# Patient Record
Sex: Female | Born: 1950 | Race: Black or African American | Hispanic: No | Marital: Married | Smoking: Current every day smoker
Health system: Southern US, Community
[De-identification: ages and names within clinical notes are randomized; demographics above are authoritative.]

## PROBLEM LIST (undated history)

## (undated) DIAGNOSIS — I10 Essential (primary) hypertension: Secondary | ICD-10-CM

## (undated) DIAGNOSIS — F32A Depression, unspecified: Secondary | ICD-10-CM

## (undated) DIAGNOSIS — H409 Unspecified glaucoma: Secondary | ICD-10-CM

## (undated) DIAGNOSIS — F329 Major depressive disorder, single episode, unspecified: Secondary | ICD-10-CM

## (undated) DIAGNOSIS — M5136 Other intervertebral disc degeneration, lumbar region: Secondary | ICD-10-CM

## (undated) DIAGNOSIS — E785 Hyperlipidemia, unspecified: Secondary | ICD-10-CM

## (undated) DIAGNOSIS — E079 Disorder of thyroid, unspecified: Secondary | ICD-10-CM

## (undated) DIAGNOSIS — M4800 Spinal stenosis, site unspecified: Secondary | ICD-10-CM

## (undated) DIAGNOSIS — J302 Other seasonal allergic rhinitis: Secondary | ICD-10-CM

## (undated) HISTORY — DX: Depression, unspecified: F32.A

## (undated) HISTORY — DX: Hyperlipidemia, unspecified: E78.5

## (undated) HISTORY — DX: Other intervertebral disc degeneration, lumbar region: M51.36

## (undated) HISTORY — DX: Major depressive disorder, single episode, unspecified: F32.9

## (undated) HISTORY — DX: Spinal stenosis, site unspecified: M48.00

## (undated) HISTORY — DX: Disorder of thyroid, unspecified: E07.9

## (undated) HISTORY — DX: Unspecified glaucoma: H40.9

---

## 1999-05-06 ENCOUNTER — Other Ambulatory Visit: Admission: RE | Admit: 1999-05-06 | Discharge: 1999-05-06 | Payer: Self-pay | Admitting: *Deleted

## 1999-06-19 ENCOUNTER — Ambulatory Visit (HOSPITAL_COMMUNITY): Admission: RE | Admit: 1999-06-19 | Discharge: 1999-06-19 | Payer: Self-pay | Admitting: Gastroenterology

## 1999-07-28 ENCOUNTER — Other Ambulatory Visit: Admission: RE | Admit: 1999-07-28 | Discharge: 1999-07-28 | Payer: Self-pay | Admitting: *Deleted

## 1999-09-16 ENCOUNTER — Ambulatory Visit (HOSPITAL_COMMUNITY): Admission: RE | Admit: 1999-09-16 | Discharge: 1999-09-16 | Payer: Self-pay | Admitting: Obstetrics and Gynecology

## 1999-09-16 ENCOUNTER — Encounter (INDEPENDENT_AMBULATORY_CARE_PROVIDER_SITE_OTHER): Payer: Self-pay

## 2000-05-23 ENCOUNTER — Other Ambulatory Visit: Admission: RE | Admit: 2000-05-23 | Discharge: 2000-05-23 | Payer: Self-pay | Admitting: *Deleted

## 2002-10-22 ENCOUNTER — Other Ambulatory Visit: Admission: RE | Admit: 2002-10-22 | Discharge: 2002-10-22 | Payer: Self-pay | Admitting: Internal Medicine

## 2002-10-31 ENCOUNTER — Encounter: Payer: Self-pay | Admitting: Internal Medicine

## 2002-10-31 ENCOUNTER — Ambulatory Visit (HOSPITAL_COMMUNITY): Admission: RE | Admit: 2002-10-31 | Discharge: 2002-10-31 | Payer: Self-pay | Admitting: Internal Medicine

## 2003-05-21 ENCOUNTER — Encounter: Admission: RE | Admit: 2003-05-21 | Discharge: 2003-08-19 | Payer: Self-pay

## 2003-12-12 ENCOUNTER — Ambulatory Visit (HOSPITAL_COMMUNITY): Admission: RE | Admit: 2003-12-12 | Discharge: 2003-12-12 | Payer: Self-pay | Admitting: Family Medicine

## 2003-12-24 ENCOUNTER — Other Ambulatory Visit: Admission: RE | Admit: 2003-12-24 | Discharge: 2003-12-24 | Payer: Self-pay | Admitting: Family Medicine

## 2005-01-13 ENCOUNTER — Inpatient Hospital Stay (HOSPITAL_COMMUNITY): Admission: RE | Admit: 2005-01-13 | Discharge: 2005-01-18 | Payer: Self-pay | Admitting: Psychiatry

## 2005-01-13 ENCOUNTER — Ambulatory Visit: Payer: Self-pay | Admitting: Psychiatry

## 2005-02-04 ENCOUNTER — Other Ambulatory Visit: Admission: RE | Admit: 2005-02-04 | Discharge: 2005-02-04 | Payer: Self-pay | Admitting: Family Medicine

## 2005-11-02 ENCOUNTER — Encounter: Admission: RE | Admit: 2005-11-02 | Discharge: 2005-11-02 | Payer: Self-pay | Admitting: Orthopedic Surgery

## 2005-11-15 ENCOUNTER — Encounter: Admission: RE | Admit: 2005-11-15 | Discharge: 2005-11-15 | Payer: Self-pay | Admitting: Orthopedic Surgery

## 2007-10-06 ENCOUNTER — Emergency Department (HOSPITAL_COMMUNITY): Admission: EM | Admit: 2007-10-06 | Discharge: 2007-10-06 | Payer: Self-pay | Admitting: Emergency Medicine

## 2008-05-07 ENCOUNTER — Ambulatory Visit (HOSPITAL_COMMUNITY): Admission: RE | Admit: 2008-05-07 | Discharge: 2008-05-07 | Payer: Self-pay | Admitting: Family Medicine

## 2009-05-27 ENCOUNTER — Emergency Department (HOSPITAL_COMMUNITY): Admission: EM | Admit: 2009-05-27 | Discharge: 2009-05-27 | Payer: Self-pay | Admitting: Family Medicine

## 2010-05-06 ENCOUNTER — Emergency Department (HOSPITAL_COMMUNITY): Admission: EM | Admit: 2010-05-06 | Discharge: 2010-05-06 | Payer: Self-pay | Admitting: Emergency Medicine

## 2010-05-08 ENCOUNTER — Ambulatory Visit (HOSPITAL_COMMUNITY): Admission: RE | Admit: 2010-05-08 | Discharge: 2010-05-08 | Payer: Self-pay | Admitting: Family Medicine

## 2010-11-08 ENCOUNTER — Encounter: Payer: Self-pay | Admitting: Family Medicine

## 2011-03-05 NOTE — Op Note (Signed)
[facility]  Patient:    Carolyn Morse                         MRN: [id number] Proc. Date: 09/16/99 Adm. Date:  [id number] Attending:  Lenoard Aden CC:         [facility]                           Operative Report  PREOPERATIVE DIAGNOSIS:  Endometrial mass with perimenopausal dysfunctional uterine bleeding.  POSTOPERATIVE DIAGNOSIS:  Endometrial polyp and thickened endometrium.  OPERATION:  Diagnostic hysteroscopy with resectoscopic polypectomy, dilatation nd curettage.  Patient recovering in good condition.  No complications.  SURGEON:  Lenoard Aden, M.D.  ANESTHESIA:  MAC and paracervical.  ESTIMATED BLOOD LOSS:  Less than 50 cc.  FLUID DEFICIT:  90 cc.  COMPLICATIONS:  None.  SPECIMENS:  Polyp and endometrial curettings.  DESCRIPTION OF PROCEDURE:  Having been apprised of the risks of anesthesia, infection, bleeding, uterine perforation, injury to abdominal organs and need for repair, patient was brought to the operating room where she was prepped and draped in the usual sterile fashion, catheterized until bladder was empty. Examination under anesthesia showed an enlarged retroflexed uterus and no adnexal masses. Uterus was grasped using a single-toothed tenaculum.  20 cc dilute Xylocaine solution placed as a paracervica block.  Dilute Pitressin solution placed at 3 nd 9 oclock cervicovaginal junction, 18 cc total.  At this time the uterus was dilated up to a #31 Pratt dilator atraumatically.  Hysteroscope placed. Visualization reveals a large posterior wall polyp which was sessile in nature. It was resected easily using a double loop resectoscope and sent to pathology for confirmation.  D&C was performed revealing copious amounts of curettings which ere also sent to pathology.  Good hemostasis was achieved.  Visualization revealed empty cavity at this time.  All instruments removed.  The patient tolerated  the  procedure well after all instruments removed and transferred to recovery in good condition. DD:  09/16/99 TD:  09/17/99 Job: [id number]

## 2011-03-05 NOTE — H&P (Signed)
[facility]  Patient:    Carolyn Morse                         MRN: [id number] Adm. Date:  [id number] Attending:  Lenoard Aden                         History and Physical  HISTORY OF PRESENT ILLNESS:   The patient is a 60 year old female, gravida 2, para 2, who presents for perimenopausal bleeding and questionable structural defect n saline sonohysterogram.  PAST MEDICAL HISTORY:         Remarkable for diverticulosis, migraine headaches, hypertension, and smoking.  PAST SURGICAL HISTORY:        Wisdom tooth surgery, oral surgery, and two vaginal deliveries.  MEDICATIONS:                  1. Verapamil.                               2. Levobid.                               3. Iron.                               4. Imitrex.  SOCIAL HISTORY:               The patient smokes one pack a day.  She denies domestic or physical violence.  PHYSICAL EXAMINATION:  GENERAL:                      The patient is a well-developed, well-nourished female in no acute distress.  VITAL SIGNS:                  Weight 203 pounds.  HEENT:                        Normal.  LUNGS:                        Clear.  HEART:                        Regular rate and rhythm.  ABDOMEN:                      Soft and nontender.  PELVIC:                       Uterus is enlarged, retroverted.  Fibroids and questionable endometrial mass noted on ultrasound.  EXTREMITIES:                  Reveal no cords.  NEUROLOGICAL:                 Nonfocal.  IMPRESSION:                   Perimenopausal dysfunctional uterine bleeding with questionable structural defect on saline sonohysterogram.  PLAN:                         To proceed with diagnostic hysteroscopy with resectoscope.  Risks of anesthesia, infection, bleeding, injury to abdominal organs with need for repair is discussed.  The patient acknowledges and desires to proceed. DD:  09/16/99 TD:  09/16/99 Job:  [id number]
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
## 2011-05-21 ENCOUNTER — Ambulatory Visit (INDEPENDENT_AMBULATORY_CARE_PROVIDER_SITE_OTHER): Payer: 59

## 2011-05-21 ENCOUNTER — Inpatient Hospital Stay (INDEPENDENT_AMBULATORY_CARE_PROVIDER_SITE_OTHER)
Admission: RE | Admit: 2011-05-21 | Discharge: 2011-05-21 | Disposition: A | Discharge status: Home / Self Care | Payer: 59 | Source: Ambulatory Visit | Attending: Family Medicine | Admitting: Family Medicine

## 2011-05-21 DIAGNOSIS — R05 Cough: Secondary | ICD-10-CM

## 2011-05-21 DIAGNOSIS — J40 Bronchitis, not specified as acute or chronic: Secondary | ICD-10-CM

## 2011-05-21 DIAGNOSIS — J019 Acute sinusitis, unspecified: Secondary | ICD-10-CM

## 2011-05-21 LAB — POCT I-STAT, CHEM 8
BUN: 5 mg/dL — ABNORMAL LOW (ref 6–23)
Calcium, Ion: 1.18 mmol/L (ref 1.12–1.32)
Chloride: 101 mEq/L (ref 96–112)
Creatinine, Ser: 0.8 mg/dL (ref 0.50–1.10)
HCT: 45 % (ref 36.0–46.0)
Hemoglobin: 15.3 g/dL — ABNORMAL HIGH (ref 12.0–15.0)
Potassium: 3.7 mEq/L (ref 3.5–5.1)
Sodium: 141 mEq/L (ref 135–145)

## 2011-05-21 LAB — POCT RAPID STREP A: Streptococcus, Group A Screen (Direct): NEGATIVE

## 2012-02-22 ENCOUNTER — Other Ambulatory Visit (HOSPITAL_COMMUNITY): Payer: Self-pay | Admitting: Family Medicine

## 2012-02-22 DIAGNOSIS — Z1231 Encounter for screening mammogram for malignant neoplasm of breast: Secondary | ICD-10-CM

## 2012-02-22 DIAGNOSIS — Z780 Asymptomatic menopausal state: Secondary | ICD-10-CM

## 2012-04-03 ENCOUNTER — Encounter (HOSPITAL_COMMUNITY): Payer: Self-pay | Admitting: *Deleted

## 2012-04-03 ENCOUNTER — Emergency Department (INDEPENDENT_AMBULATORY_CARE_PROVIDER_SITE_OTHER)
Admission: EM | Admit: 2012-04-03 | Discharge: 2012-04-03 | Disposition: A | Discharge status: Home / Self Care | Payer: 59 | Source: Home / Self Care | Attending: Emergency Medicine | Admitting: Emergency Medicine

## 2012-04-03 DIAGNOSIS — J040 Acute laryngitis: Secondary | ICD-10-CM

## 2012-04-03 DIAGNOSIS — J209 Acute bronchitis, unspecified: Secondary | ICD-10-CM

## 2012-04-03 HISTORY — DX: Essential (primary) hypertension: I10

## 2012-04-03 HISTORY — DX: Other seasonal allergic rhinitis: J30.2

## 2012-04-03 MED ORDER — FLUTICASONE PROPIONATE HFA 110 MCG/ACT IN AERO: 1.0000 | INHALATION_SPRAY | Freq: Two times a day (BID) | RESPIRATORY_TRACT | Status: DC

## 2012-04-03 MED ORDER — HYDROCOD POLST-CHLORPHEN POLST 10-8 MG/5ML PO LQCR: 5.0000 mL | Freq: Every evening | ORAL | Status: DC | PRN

## 2012-04-03 NOTE — ED Provider Notes (Signed)
History     CSN: [id number]  Arrival date & time 04/03/12  1114   First MD Initiated Contact with Patient 04/03/12 1115      Chief Complaint  Patient presents with  . URI    HPI   1 wk of cough, sore throat, chest congestion, loss of voice. Now has some trouble taking a deep breath and some dyspnea on exertion. Having some rhinorrhea.  Has tried Nyquil and Mucinex. Continues to smoke.  Right side of her back is sore and worse when she coughs.   Past Medical History  Diagnosis Date  . Diabetes mellitus   . Seasonal allergies   . Hypertension     History reviewed. No pertinent past surgical history.  Family History  Problem Relation Age of Onset  . Family history unknown: Yes    History  Substance Use Topics  . Smoking status:  current smoker- 4-6 cig per day  . Smokeless tobacco: no  . Alcohol Use: No  Works as Child psychotherapist. Lives w/ husband who is not sick.   OB History    Grav Para Term Preterm Abortions TAB SAB Ect Mult Living                  Review of Systems  Constitutional: Positive for fever, activity change, appetite change and fatigue.  HENT: Positive for congestion, rhinorrhea, sneezing and postnasal drip. Negative for hearing loss, ear pain, nosebleeds, tinnitus and ear discharge.   Eyes: Negative.   Respiratory: Positive for cough, chest tightness and shortness of breath. Negative for wheezing.   Cardiovascular: Negative.   Gastrointestinal: Negative.   Genitourinary: Negative.   Musculoskeletal: Negative.   Neurological: Negative.   Psychiatric/Behavioral: Negative.     Allergies  Review of patient's allergies indicates no known allergies.  Home Medications   Current Outpatient Rx  Name Route Sig Dispense Refill  . AMLODIPINE  Oral Take 1 capsule by mouth daily.    . DULOXETINE HCL 20 MG PO CPEP Oral Take by mouth daily.    Marland Kitchen FLUTICASONE PROPIONATE 50 MCG/ACT NA SUSP Nasal Place into the nose daily.    Marland Kitchen LEVOTHYROXINE SODIUM 100 MCG  PO TABS Oral Take by mouth daily.    Marland Kitchen METFORMIN HCL 1000 MG PO TABS Oral Take by mouth 2 (two) times daily with a meal.    . MONTELUKAST SODIUM 10 MG PO TABS Oral Take by mouth at bedtime.    Also taking another BP pill.  Albuterol inhaler Atorvastatin.   BP 117/75  Pulse 97  Temp 98.4 F (36.9 C) (Oral)  Resp 18  SpO2 100%  Physical Exam  Constitutional: She is oriented to person, place, and time. She appears well-developed and well-nourished.  HENT:  Head: Normocephalic.  Mouth/Throat: No oropharyngeal exudate.  Eyes: Conjunctivae and EOM are normal. Pupils are equal, round, and reactive to light.  Cardiovascular: Normal rate, regular rhythm and normal heart sounds.   Pulmonary/Chest: Effort normal and breath sounds normal. She has no wheezes. She has no rales. She exhibits no tenderness.       Hoarse cough  Abdominal: Soft. Bowel sounds are normal.  Neurological: She is alert and oriented to person, place, and time.  Skin: Skin is warm and dry.  Psychiatric: She has a normal mood and affect. Her behavior is normal. Judgment and thought content normal.    ED Course  Procedures (including critical care time)  Labs Reviewed - No data to display No results found.   No  diagnosis found.    MDM  Viral bronchitis - codeine containing cough surpressent and flovent        Calvert Cantor, MD 04/03/12 1750

## 2012-04-03 NOTE — ED Notes (Signed)
Pt reports loss of voice and cough/congestion for the past week. States that cough is "sometimes " productive.

## 2012-05-09 ENCOUNTER — Ambulatory Visit (HOSPITAL_COMMUNITY)
Admission: RE | Admit: 2012-05-09 | Discharge: 2012-05-09 | Disposition: A | Discharge status: Home / Self Care | Payer: 59 | Source: Ambulatory Visit | Attending: Family Medicine | Admitting: Family Medicine

## 2012-05-09 DIAGNOSIS — Z1231 Encounter for screening mammogram for malignant neoplasm of breast: Secondary | ICD-10-CM

## 2012-05-09 DIAGNOSIS — Z780 Asymptomatic menopausal state: Secondary | ICD-10-CM

## 2012-05-12 ENCOUNTER — Other Ambulatory Visit: Payer: Self-pay | Admitting: Family Medicine

## 2012-05-12 DIAGNOSIS — R928 Other abnormal and inconclusive findings on diagnostic imaging of breast: Secondary | ICD-10-CM

## 2012-05-18 ENCOUNTER — Ambulatory Visit
Admission: RE | Admit: 2012-05-18 | Discharge: 2012-05-18 | Disposition: A | Discharge status: Home / Self Care | Payer: 59 | Source: Ambulatory Visit | Attending: Family Medicine | Admitting: Family Medicine

## 2012-05-18 DIAGNOSIS — R928 Other abnormal and inconclusive findings on diagnostic imaging of breast: Secondary | ICD-10-CM

## 2012-12-21 ENCOUNTER — Other Ambulatory Visit (HOSPITAL_COMMUNITY): Payer: Self-pay | Admitting: *Deleted

## 2012-12-21 ENCOUNTER — Ambulatory Visit (HOSPITAL_COMMUNITY)
Admission: RE | Admit: 2012-12-21 | Discharge: 2012-12-21 | Disposition: A | Discharge status: Home / Self Care | Payer: Disability Insurance | Source: Ambulatory Visit | Attending: Family Medicine | Admitting: Family Medicine

## 2012-12-21 DIAGNOSIS — M1990 Unspecified osteoarthritis, unspecified site: Secondary | ICD-10-CM

## 2012-12-21 DIAGNOSIS — M542 Cervicalgia: Secondary | ICD-10-CM | POA: Insufficient documentation

## 2012-12-21 DIAGNOSIS — M47812 Spondylosis without myelopathy or radiculopathy, cervical region: Secondary | ICD-10-CM | POA: Insufficient documentation

## 2014-11-03 IMAGING — CR DG CERVICAL SPINE 2 OR 3 VIEWS
3 series · 3 of 3 positions shown · non-contrast
Comparison: None

CLINICAL DATA: Neck pain.  Osteoarthritis.

CERVICAL SPINE - 2-3 VIEW

[view not recorded (1 of 3)]
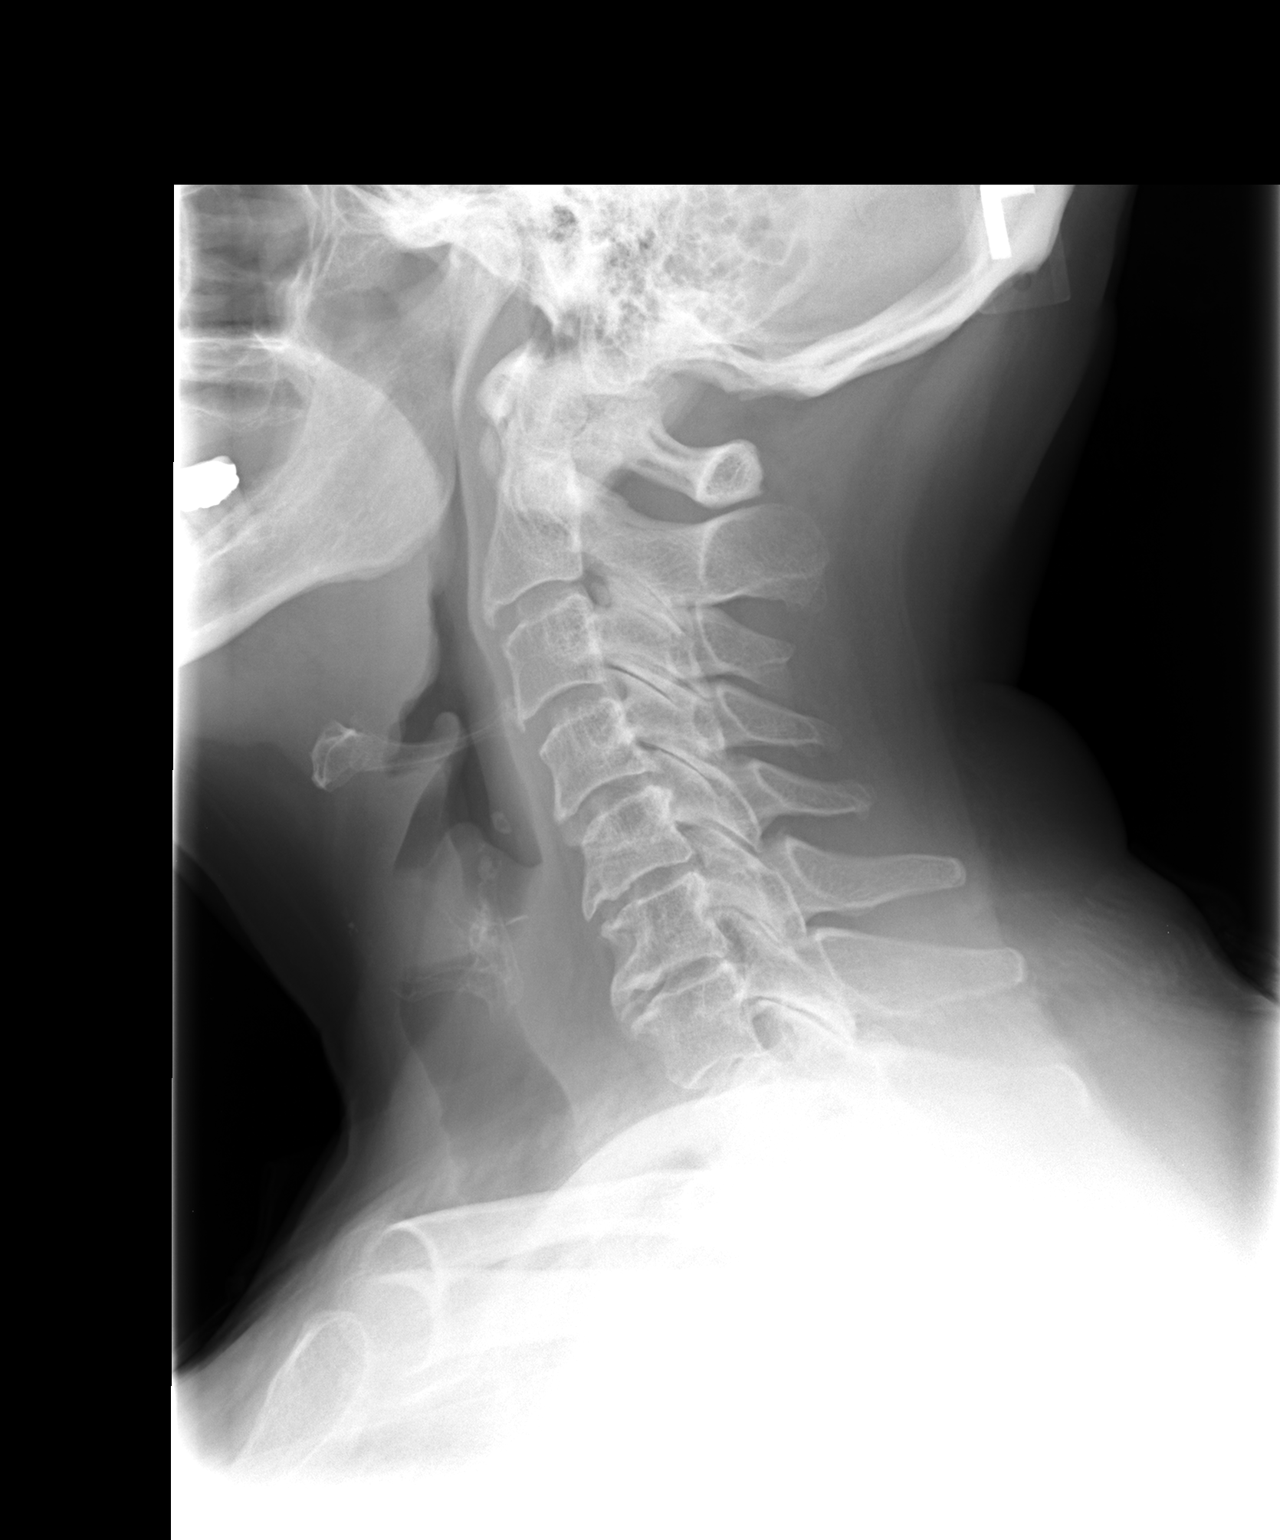

[view not recorded (2 of 3)]
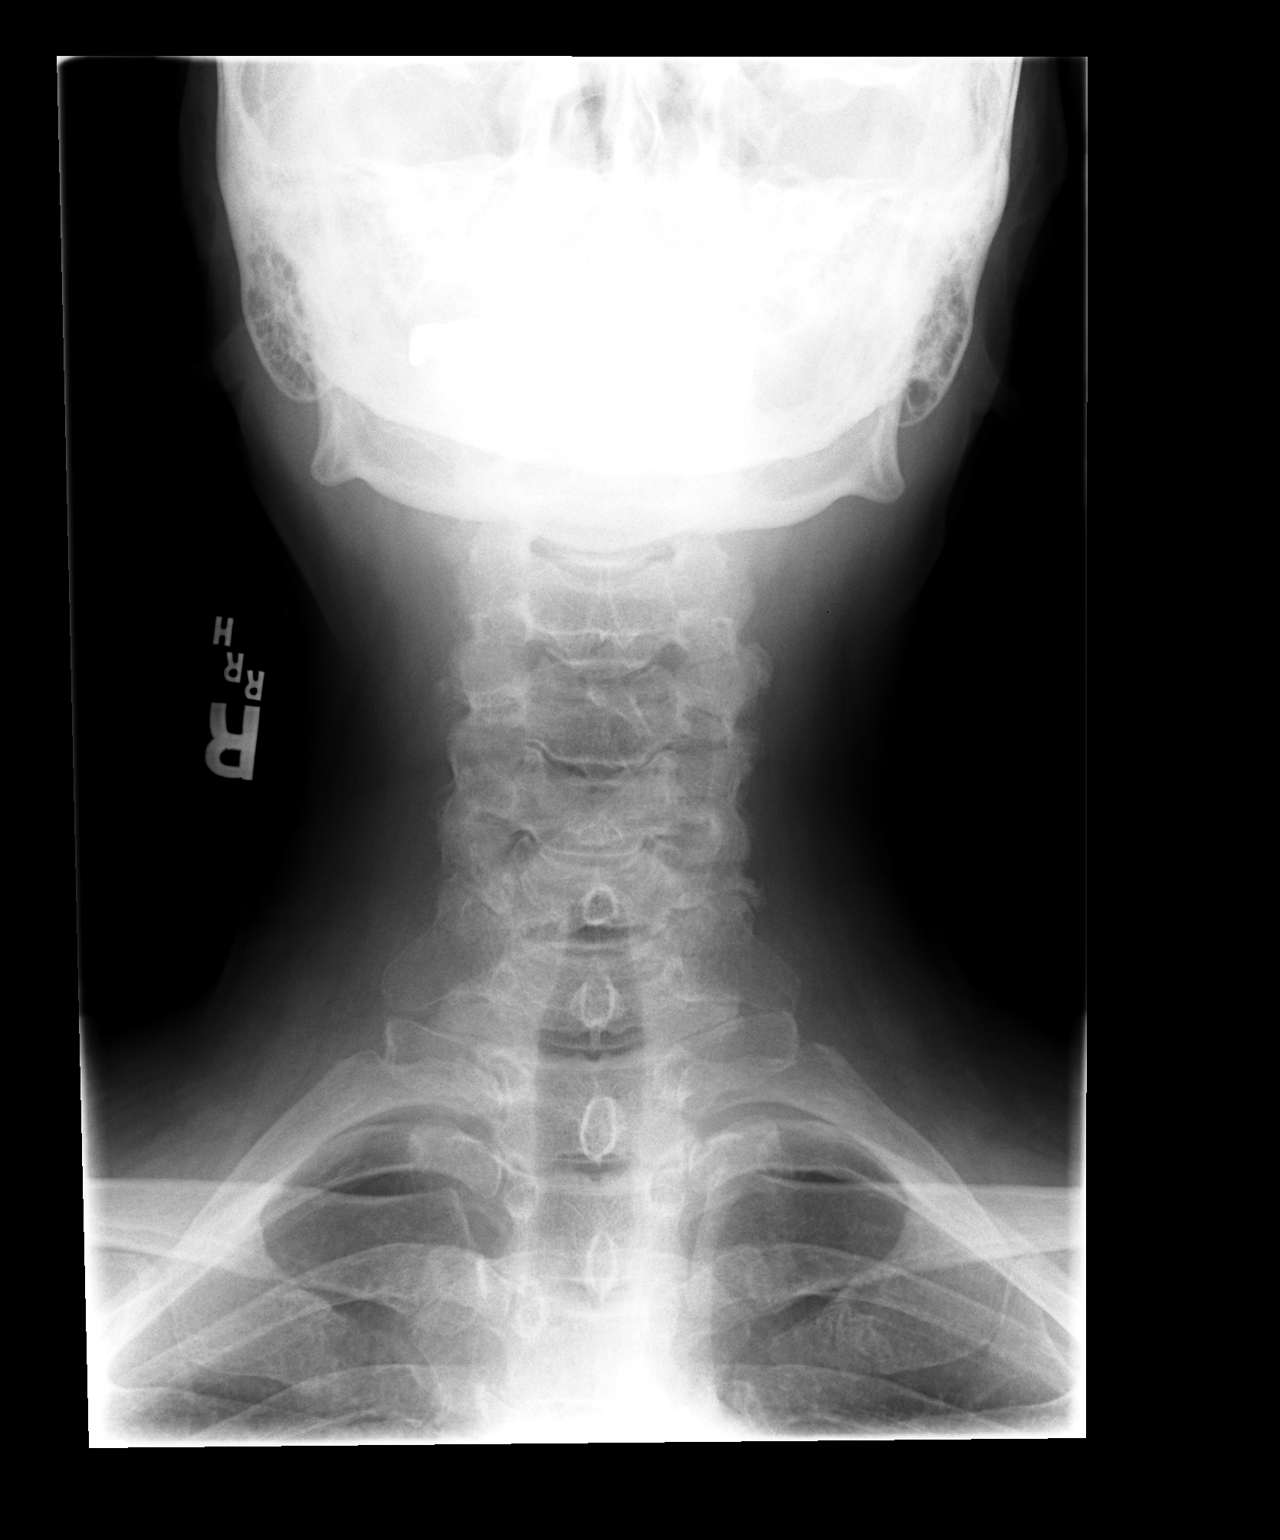

[view not recorded (3 of 3)]
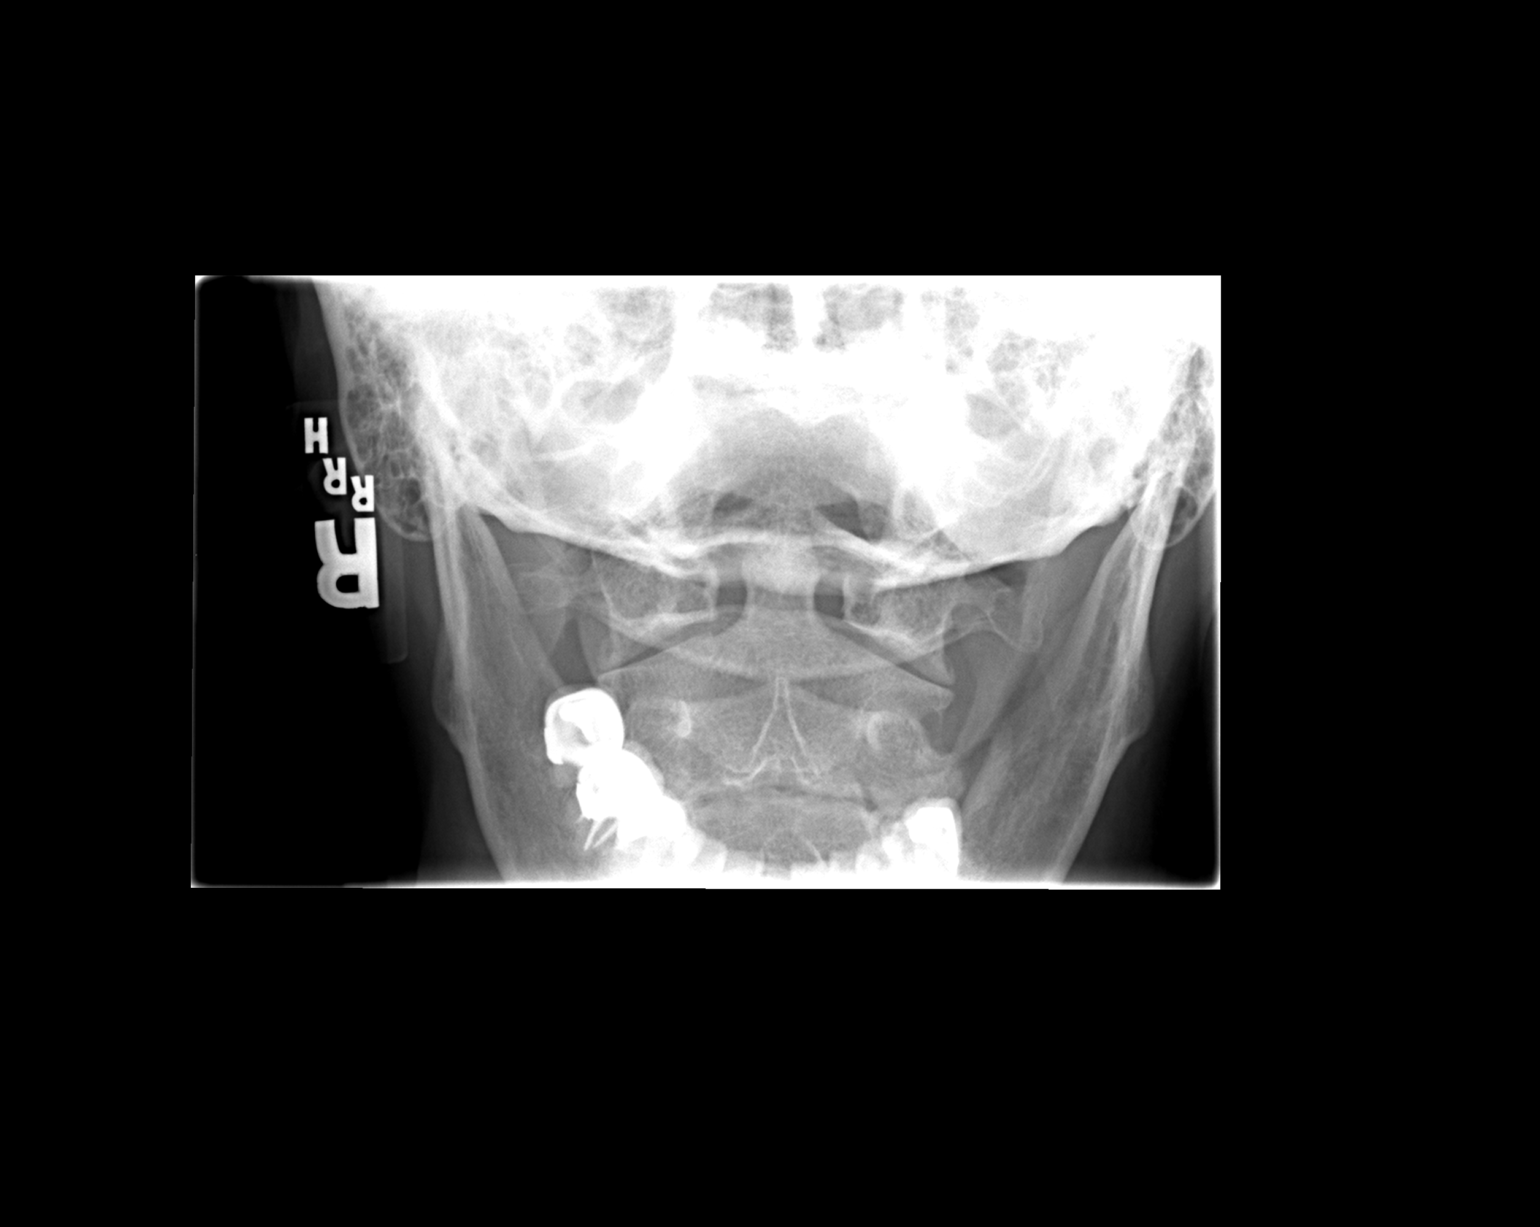

[3 of 3 positions shown; findings below may reference images not displayed]

FINDINGS: Disc degeneration and spondylosis at C4-5 and C5-6 and C6-
7 of a moderate degree.  Mild facet degeneration throughout the
cervical spine.  Negative for fracture or mass lesion.  Oblique
views were not obtained to evaluate for foraminal encroachment.
IMPRESSION: Moderate spondylosis.

## 2016-11-10 DIAGNOSIS — M5441 Lumbago with sciatica, right side: Secondary | ICD-10-CM | POA: Diagnosis not present

## 2016-11-10 DIAGNOSIS — M545 Low back pain: Secondary | ICD-10-CM | POA: Diagnosis not present

## 2016-11-10 DIAGNOSIS — F329 Major depressive disorder, single episode, unspecified: Secondary | ICD-10-CM | POA: Diagnosis not present

## 2016-11-10 DIAGNOSIS — M5431 Sciatica, right side: Secondary | ICD-10-CM | POA: Diagnosis not present

## 2016-11-10 DIAGNOSIS — E119 Type 2 diabetes mellitus without complications: Secondary | ICD-10-CM | POA: Diagnosis not present

## 2016-11-10 DIAGNOSIS — K219 Gastro-esophageal reflux disease without esophagitis: Secondary | ICD-10-CM | POA: Diagnosis not present

## 2016-11-10 DIAGNOSIS — Z87891 Personal history of nicotine dependence: Secondary | ICD-10-CM | POA: Diagnosis not present

## 2016-11-19 DIAGNOSIS — M5416 Radiculopathy, lumbar region: Secondary | ICD-10-CM | POA: Diagnosis not present

## 2016-12-02 DIAGNOSIS — M5416 Radiculopathy, lumbar region: Secondary | ICD-10-CM | POA: Diagnosis not present

## 2016-12-21 DIAGNOSIS — M5416 Radiculopathy, lumbar region: Secondary | ICD-10-CM | POA: Diagnosis not present

## 2017-01-06 DIAGNOSIS — M5416 Radiculopathy, lumbar region: Secondary | ICD-10-CM | POA: Diagnosis not present

## 2017-01-28 DIAGNOSIS — M5416 Radiculopathy, lumbar region: Secondary | ICD-10-CM | POA: Diagnosis not present

## 2017-02-03 DIAGNOSIS — M545 Low back pain: Secondary | ICD-10-CM | POA: Diagnosis not present

## 2017-02-07 DIAGNOSIS — M48062 Spinal stenosis, lumbar region with neurogenic claudication: Secondary | ICD-10-CM | POA: Diagnosis not present

## 2017-02-22 DIAGNOSIS — M48062 Spinal stenosis, lumbar region with neurogenic claudication: Secondary | ICD-10-CM | POA: Diagnosis not present

## 2017-03-21 DIAGNOSIS — M48062 Spinal stenosis, lumbar region with neurogenic claudication: Secondary | ICD-10-CM | POA: Diagnosis not present

## 2017-04-06 ENCOUNTER — Ambulatory Visit (INDEPENDENT_AMBULATORY_CARE_PROVIDER_SITE_OTHER): Payer: PPO | Admitting: Family Medicine

## 2017-04-06 ENCOUNTER — Encounter: Payer: Self-pay | Admitting: Family Medicine

## 2017-04-06 VITALS — BP 136/72 | HR 114 | Temp 98.0°F | Resp 14 | Ht 66.5 in | Wt 210.0 lb

## 2017-04-06 DIAGNOSIS — I10 Essential (primary) hypertension: Secondary | ICD-10-CM | POA: Diagnosis not present

## 2017-04-06 DIAGNOSIS — H409 Unspecified glaucoma: Secondary | ICD-10-CM | POA: Diagnosis not present

## 2017-04-06 DIAGNOSIS — H0260 Xanthelasma of unspecified eye, unspecified eyelid: Secondary | ICD-10-CM

## 2017-04-06 DIAGNOSIS — M5136 Other intervertebral disc degeneration, lumbar region: Secondary | ICD-10-CM

## 2017-04-06 DIAGNOSIS — E785 Hyperlipidemia, unspecified: Secondary | ICD-10-CM | POA: Diagnosis not present

## 2017-04-06 DIAGNOSIS — F5104 Psychophysiologic insomnia: Secondary | ICD-10-CM | POA: Diagnosis not present

## 2017-04-06 DIAGNOSIS — E039 Hypothyroidism, unspecified: Secondary | ICD-10-CM

## 2017-04-06 DIAGNOSIS — Z0000 Encounter for general adult medical examination without abnormal findings: Secondary | ICD-10-CM

## 2017-04-06 DIAGNOSIS — F331 Major depressive disorder, recurrent, moderate: Secondary | ICD-10-CM | POA: Diagnosis not present

## 2017-04-06 DIAGNOSIS — M48062 Spinal stenosis, lumbar region with neurogenic claudication: Secondary | ICD-10-CM | POA: Diagnosis not present

## 2017-04-06 DIAGNOSIS — E1169 Type 2 diabetes mellitus with other specified complication: Secondary | ICD-10-CM

## 2017-04-06 DIAGNOSIS — M4800 Spinal stenosis, site unspecified: Secondary | ICD-10-CM | POA: Insufficient documentation

## 2017-04-06 DIAGNOSIS — M51369 Other intervertebral disc degeneration, lumbar region without mention of lumbar back pain or lower extremity pain: Secondary | ICD-10-CM | POA: Insufficient documentation

## 2017-04-06 DIAGNOSIS — F329 Major depressive disorder, single episode, unspecified: Secondary | ICD-10-CM | POA: Insufficient documentation

## 2017-04-06 DIAGNOSIS — E119 Type 2 diabetes mellitus without complications: Secondary | ICD-10-CM | POA: Diagnosis not present

## 2017-04-06 NOTE — Assessment & Plan Note (Signed)
Fair control HR elevated out of Coreg, will give bystolic 5mg  daily sample until she is able to get her medications in

## 2017-04-06 NOTE — Progress Notes (Signed)
Subjective:   Patient presents for Medicare Annual/Subsequent preventive examination.  Patient here to establish care and for welcome to Medicare examination. [facility] she is retired from [employer]  She has notation from no foreign healthcare back in 02/27/2012 at that time she is being treated for diabetes mellitus as well as hypertension, hot flashes and episodes of bronchitis. Medications and History Reviewed   Diabetes mellitus diagnosed greater than 10 years ago. She states that she had an A1c done a couple months ago was around 8% however chlamydial ride was added to her metformin. She admits that she is a lot of snacks and junk food has been to a nutritionist in the past but this has not changed her eating habits. She also had little activity.  She has history of chronic back pain has spinal stenosis and degenerative disc disease she is followed by [facility] she's had injections in her spine she is trying to avoid back surgery. She is on hydrocodone which now is prescribed from the [employer] as well as Flexeril and meloxicam states that she is also on venlafaxine because of her pain. In the past she was on Cymbalta but this was not covered by her insurance. However in lieu of that she has history of depression and has been on Prozac and respiratory down she had an admission to behavioral health back to 2005-02-26 after her son died from complications of type 1 diabetes. She states that she has felt depressed since then. She was on lorazepam to help her sleep but this was discontinued by the [employer]. She has now been on Ambien which she takes 5 mg as needed.  History of hypothyroidism found via routine blood work she has been on thyroid replacement for greater than 10 years.  History of hypertension she is on amlodipine and losartan and carvedilol states that her carvedilol ran out she is waiting for it to arrive therefore she has not been on  the past few days she has not had any palpitations.  She states she's had all of her immunizations she's had Pap smear mammogram bone density all done at either [facility] [facility] or the [facility]  Tobacco use she does smoke intermittently a few cigarettes a day has stopped in the past but then will pick it back up. She states she does not smoke continuously  She would likely refer to dermatology for recurrence of the plaques around her eyes she notes it is due to her cholesterol. She was being followed by them in the past.  History of glaucoma and she is on eyedrops she is not sure about retinopathy [facility] but she does have a local ophthalmologist which she cannot recall the name   Review Past Medical/Family/Social: Per EMR    Risk Factors  Current exercise habits:  Dietary issues discussed:   Cardiac risk factors: Obesity (BMI >= 30 kg/m2).   Depression Screen  (Note: if answer to either of the following is "Yes", a more complete depression screening is indicated)  Over the past two weeks, have you felt down, depressed or hopeless? No Over the past two weeks, have you felt little interest or pleasure in doing things? No Have you lost interest or pleasure in daily life? No Do you often feel hopeless? No Do you cry easily over simple problems? No   Activities of Daily Living  In your present state of health, do you have any difficulty performing the  following activities?:  Driving? No  Managing money? No  Feeding yourself? No  Getting from bed to chair? No  Climbing a flight of stairs? yes  Preparing food and eating?: No  Bathing or showering? No  Getting dressed: No  Getting to the toilet? No  Using the toilet:No  Moving around from place to place: No  In the past year have you fallen or had a near fall?:No  Are you sexually active? Yes Do you have more than one partner? No   Hearing Difficulties: No  Do you often  ask people to speak up or repeat themselves? No  Do you experience ringing or noises in your ears? No Do you have difficulty understanding soft or whispered voices? No  Do you feel that you have a problem with memory? No Do you often misplace items? No  Do you feel safe at home? Yes  Cognitive Testing  Alert? Yes Normal Appearance?Yes  Oriented to person? Yes Place? Yes  Time? Yes  Recall of three objects? Yes  Can perform simple calculations? Yes  Displays appropriate judgment?Yes  Can read the correct time from a watch face?Yes   List the Names of Other Physician/Practitioners you currently use: [facility]   Screening Tests / Date  ( AWAIT RECORDS DONE PER [facility] AT [facility]) Colonoscopy  - Cologaurd per pt                    Zostavax  Mammogram  Influenza Vaccine  Tetanus/tdap  ROS: GEN- denies fatigue, fever, weight loss,weakness, recent illness HEENT- denies eye drainage, change in vision, nasal discharge, CVS- denies chest pain, palpitations RESP- denies SOB, cough, wheeze ABD- denies N/V, change in stools, abd pain GU- denies dysuria, hematuria, dribbling, incontinence MSK- + joint pain, muscle aches, injury Neuro- denies headache, dizziness, syncope, seizure activity  Physical: GEN- NAD, alert and oriented x3,obese  HEENT- PERRL, EOMI, non injected sclera, pink conjunctiva, MMM, oropharynx clear, yellow plaque like lesions upper lids and below lower lids bilat  Neck- Supple, no thryomegaly CVS- Tachycardic HR 110, no murmur RESP-CTAB ABD-NABS,soft,NT,ND Psych- Normal affect and mood, very polite  EXT- No edema Pulses- Radial, DP- 2+     Assessment:    Annual wellness medicare exam   Plan:    During the course of the visit the patient was educated and counseled about appropriate screening and preventive services including:  [facility] and [facility]  She is UTD on immunizations and preventive medicine per report [place] the [place] she request  return to her dermatologist, referral to be done   See below   Diet review for nutrition referral? Yes ____ Not Indicated __x__  PT DECLINES  Patient Instructions (the written plan) was given to the patient.  Medicare Attestation  I have personally reviewed:  The patient's medical and social history  Their use of alcohol, tobacco or illicit drugs  Their current medications and supplements  The patient's functional ability including ADLs,fall risks, home safety risks, cognitive, and hearing and visual impairment  Diet and physical activities  Evidence for depression or mood disorders  The patient's weight, height, BMI, and visual acuity have been recorded in the chart. I have made referrals, counseling, and provided education to the patient based on review of the above and I have provided the patient with a written personalized care plan for preventive services.

## 2017-04-06 NOTE — Assessment & Plan Note (Signed)
Continue statin drug   

## 2017-04-06 NOTE — Assessment & Plan Note (Signed)
Continue with orthopedics, obtain records

## 2017-04-06 NOTE — Assessment & Plan Note (Signed)
Obtain records- she will print her [facility] labs and records and return for my review Recent addition of glimperide, discussed her diet/nutrition which she admits to poor eating and snacking She is on statin and ACE

## 2017-04-06 NOTE — Assessment & Plan Note (Addendum)
Underlying depression stemming from her sons death years ago She is on effexor for both pain control and mood Now off benzos

## 2017-04-06 NOTE — Patient Instructions (Addendum)
[facility]  Print off records- from last 2 years for labs/ notes,/ PAP smear/Colonoscopy/Mammogram/Bone Density /Shot records Referral to dermatology - Dr. Margo AyeHall  Take the bystolic 5mg  once a day until your COREG comes it F/U 3 months

## 2017-04-06 NOTE — Assessment & Plan Note (Signed)
Maintained on prn Remus Lofflerambien

## 2017-05-26 DIAGNOSIS — M48062 Spinal stenosis, lumbar region with neurogenic claudication: Secondary | ICD-10-CM | POA: Diagnosis not present

## 2017-07-08 ENCOUNTER — Ambulatory Visit: Payer: PPO | Admitting: Family Medicine

## 2017-07-13 ENCOUNTER — Encounter: Payer: Self-pay | Admitting: Family Medicine

## 2017-07-26 DIAGNOSIS — M48062 Spinal stenosis, lumbar region with neurogenic claudication: Secondary | ICD-10-CM | POA: Diagnosis not present

## 2017-11-01 LAB — HM MAMMOGRAPHY

## 2017-11-30 DIAGNOSIS — M48062 Spinal stenosis, lumbar region with neurogenic claudication: Secondary | ICD-10-CM | POA: Diagnosis not present

## 2018-01-09 DIAGNOSIS — M47816 Spondylosis without myelopathy or radiculopathy, lumbar region: Secondary | ICD-10-CM | POA: Diagnosis not present

## 2018-01-09 DIAGNOSIS — M9903 Segmental and somatic dysfunction of lumbar region: Secondary | ICD-10-CM | POA: Diagnosis not present

## 2018-01-11 DIAGNOSIS — M47816 Spondylosis without myelopathy or radiculopathy, lumbar region: Secondary | ICD-10-CM | POA: Diagnosis not present

## 2018-01-11 DIAGNOSIS — M9903 Segmental and somatic dysfunction of lumbar region: Secondary | ICD-10-CM | POA: Diagnosis not present

## 2018-01-18 DIAGNOSIS — M48062 Spinal stenosis, lumbar region with neurogenic claudication: Secondary | ICD-10-CM | POA: Diagnosis not present

## 2018-01-23 DIAGNOSIS — M47816 Spondylosis without myelopathy or radiculopathy, lumbar region: Secondary | ICD-10-CM | POA: Diagnosis not present

## 2018-01-23 DIAGNOSIS — M9903 Segmental and somatic dysfunction of lumbar region: Secondary | ICD-10-CM | POA: Diagnosis not present

## 2018-01-26 DIAGNOSIS — M47816 Spondylosis without myelopathy or radiculopathy, lumbar region: Secondary | ICD-10-CM | POA: Diagnosis not present

## 2018-01-26 DIAGNOSIS — M9903 Segmental and somatic dysfunction of lumbar region: Secondary | ICD-10-CM | POA: Diagnosis not present

## 2018-01-30 DIAGNOSIS — M9903 Segmental and somatic dysfunction of lumbar region: Secondary | ICD-10-CM | POA: Diagnosis not present

## 2018-01-30 DIAGNOSIS — M47816 Spondylosis without myelopathy or radiculopathy, lumbar region: Secondary | ICD-10-CM | POA: Diagnosis not present

## 2018-02-02 DIAGNOSIS — M47816 Spondylosis without myelopathy or radiculopathy, lumbar region: Secondary | ICD-10-CM | POA: Diagnosis not present

## 2018-02-02 DIAGNOSIS — M9903 Segmental and somatic dysfunction of lumbar region: Secondary | ICD-10-CM | POA: Diagnosis not present

## 2018-02-09 DIAGNOSIS — M47816 Spondylosis without myelopathy or radiculopathy, lumbar region: Secondary | ICD-10-CM | POA: Diagnosis not present

## 2018-02-09 DIAGNOSIS — M9903 Segmental and somatic dysfunction of lumbar region: Secondary | ICD-10-CM | POA: Diagnosis not present

## 2018-03-03 DIAGNOSIS — M9903 Segmental and somatic dysfunction of lumbar region: Secondary | ICD-10-CM | POA: Diagnosis not present

## 2018-03-03 DIAGNOSIS — M47816 Spondylosis without myelopathy or radiculopathy, lumbar region: Secondary | ICD-10-CM | POA: Diagnosis not present

## 2018-03-17 DIAGNOSIS — M9903 Segmental and somatic dysfunction of lumbar region: Secondary | ICD-10-CM | POA: Diagnosis not present

## 2018-03-17 DIAGNOSIS — M47816 Spondylosis without myelopathy or radiculopathy, lumbar region: Secondary | ICD-10-CM | POA: Diagnosis not present

## 2018-05-18 DIAGNOSIS — M7061 Trochanteric bursitis, right hip: Secondary | ICD-10-CM | POA: Diagnosis not present

## 2018-05-18 DIAGNOSIS — M545 Low back pain: Secondary | ICD-10-CM | POA: Diagnosis not present

## 2018-06-01 ENCOUNTER — Other Ambulatory Visit: Payer: Self-pay

## 2018-06-01 ENCOUNTER — Ambulatory Visit (INDEPENDENT_AMBULATORY_CARE_PROVIDER_SITE_OTHER): Payer: PPO | Admitting: Family Medicine

## 2018-06-01 ENCOUNTER — Encounter: Payer: Self-pay | Admitting: Family Medicine

## 2018-06-01 ENCOUNTER — Encounter: Payer: Self-pay | Admitting: *Deleted

## 2018-06-01 VITALS — BP 134/78 | HR 62 | Temp 97.9°F | Resp 12 | Ht 67.0 in | Wt 212.0 lb

## 2018-06-01 DIAGNOSIS — Z9181 History of falling: Secondary | ICD-10-CM | POA: Diagnosis not present

## 2018-06-01 DIAGNOSIS — Z0000 Encounter for general adult medical examination without abnormal findings: Secondary | ICD-10-CM

## 2018-06-01 DIAGNOSIS — Z6833 Body mass index (BMI) 33.0-33.9, adult: Secondary | ICD-10-CM

## 2018-06-01 DIAGNOSIS — E1169 Type 2 diabetes mellitus with other specified complication: Secondary | ICD-10-CM

## 2018-06-01 DIAGNOSIS — E785 Hyperlipidemia, unspecified: Secondary | ICD-10-CM | POA: Diagnosis not present

## 2018-06-01 DIAGNOSIS — M5136 Other intervertebral disc degeneration, lumbar region: Secondary | ICD-10-CM

## 2018-06-01 DIAGNOSIS — M48062 Spinal stenosis, lumbar region with neurogenic claudication: Secondary | ICD-10-CM

## 2018-06-01 DIAGNOSIS — E039 Hypothyroidism, unspecified: Secondary | ICD-10-CM

## 2018-06-01 DIAGNOSIS — E669 Obesity, unspecified: Secondary | ICD-10-CM

## 2018-06-01 DIAGNOSIS — I10 Essential (primary) hypertension: Secondary | ICD-10-CM | POA: Diagnosis not present

## 2018-06-01 DIAGNOSIS — E119 Type 2 diabetes mellitus without complications: Secondary | ICD-10-CM

## 2018-06-01 DIAGNOSIS — Z1159 Encounter for screening for other viral diseases: Secondary | ICD-10-CM

## 2018-06-01 NOTE — Patient Instructions (Addendum)
  Ms. Carolyn Morse , Thank you for taking time to come for your Medicare Wellness Visit. I appreciate your ongoing commitment to your health goals. Please review the following plan we discussed and let me know if I can assist you in the future.   These are the goals we discussed: Goals    . DIET - REDUCE CALORIE INTAKE       This is a list of the screening recommended for you and due dates:  Health Maintenance  Topic Date Due  . Eye exam for diabetics  08/27/1961  . Colon Cancer Screening  08/27/2001  . Flu Shot  05/18/2018  . Pneumonia vaccines (2 of 2 - PPSV23) 08/09/2018  . Hemoglobin A1C  12/02/2018  . Complete foot exam   06/02/2019  . Mammogram  11/02/2019  . Tetanus Vaccine  02/21/2022  . DEXA scan (bone density measurement)  Completed  .  Hepatitis C: One time screening is recommended by Center for Disease Control  (CDC) for  adults born from [birth year] through 1965.   Completed     Medical Screening Exam A medical screening exam helps determine whether or not you need emergency medical treatment. During the medical screening exam, a health care provider does a short physical exam and medical history to assess:  Your current symptoms.  Your overall health.  Depending on your symptoms, you may need additional tests. What are the possible outcomes of a medical screening exam? Your medical screening exam may determine that:  You do not need emergency treatment at this time.  You need treatment right away.  You need to be transferred to another medical center.  When should I seek medical care? If you have a regular health care provider, make an appointment for a follow-up visit with him or her. If you do not have a regular health care provider, ask about resources in your community. Get help right away if: Your condition may change over time. If your condition gets worse or you develop new or troubling symptoms before you see your health care provider, go to an emergency  department right away. In an emergency:  Call 911 or have someone drive you to the nearest hospital.  Do not drive yourself. This information is not intended to replace advice given to you by your health care provider. Make sure you discuss any questions you have with your health care provider. Document Released: 11/11/2004 Document Revised: 06/15/2016 Document Reviewed: 07/17/2015 Elsevier Interactive Patient Education  [employer]2018 [employer]

## 2018-06-01 NOTE — Progress Notes (Signed)
Subjective:   Carolyn Morse is a 67 y.o. female who presents for Medicare Annual (Subsequent) preventive examination.  Sees Dr. Buelah Manis as PCP only for [facility] per pt and all other care is at [facility]  Review of Systems:  Review of Systems  Constitutional: Negative.  Negative for chills, fever and weight loss.  HENT: Negative.   Eyes: Negative.   Respiratory: Negative.   Cardiovascular: Negative.  Negative for chest pain, palpitations, orthopnea, claudication, leg swelling and PND.  Gastrointestinal: Negative.   Genitourinary: Negative.  Negative for dysuria, frequency and urgency.  Musculoskeletal: Positive for back pain, falls and joint pain. Negative for myalgias and neck pain.  Skin: Negative.   Neurological: Negative.   Endo/Heme/Allergies: Negative.   Psychiatric/Behavioral: Negative.  Negative for depression, memory loss and suicidal ideas. The patient is not nervous/anxious.   All other systems reviewed and are negative.   Cardiac Risk Factors include: advanced age (>42mn, >>29women);diabetes mellitus;dyslipidemia;hypertension;obesity (BMI >30kg/m2)  Patient reports a fall that occurred in March 2019 stating that she fell/tripped on a ramp on sidewalk going down to the parking lot she caught herself with her right hand and did hit her head was evaluated at that time.  She denies any other falls since then.  Patient does report history of diabetes she usually checks her sugars in the morning, reports in a good morning such as today her sugars being around 114, but does range from low 100s to her highest being 280.  [place] [facility]  [facility] including her mammograms, Pap smears, eye exam for glaucoma and screening exams for diabetes.  She also reports history of [place] [facility] [place] [facility]  [place]  [facility], Dr.  WPatrick Jupiterand Dr. GBerenice Primashave done spinal injections and cortisone shots in her back and hip, they have also perform an MRI.  Hx of hypothyroid, [place] [facility] [place]  No hair, skin, weight, energy changes.  States she quit smoking one week ago, has intermittently smoked for several years     Objective:     Vitals: BP 134/78   Pulse 62   Temp 97.9 F (36.6 C) (Oral)   Resp 12   Ht 5' 7"  (1.702 m)   Wt 212 lb (96.2 kg)   SpO2 97%   BMI 33.20 kg/m   Body mass index is 33.2 kg/m.  Advanced Directives 06/01/2018  Does Patient Have a Medical Advance Directive? No  Would patient like information on creating a medical advance directive? Yes (MAU/Ambulatory/Procedural Areas - Information given)    Tobacco Social History   Tobacco Use  Smoking Status Former Smoker  . Types: Cigarettes  . Last attempt to quit: 05/17/2018  . Years since quitting: 0.0  Smokeless Tobacco Never Used     Counseling given: Not Answered Smoking cessation instruction/counseling given:  commended patient for quitting and reviewed strategies for preventing relapses   Clinical Intake:  Pre-visit preparation completed: No  Pain : 0-10 Pain Score: 4  Pain Type: Chronic pain Pain Location: Hip Pain Orientation: Right Pain Onset: More than a month ago Pain Frequency: Constant Pain Relieving Factors: chronic pain meds, muscle relaxers, injections from ortho Effect of Pain on Daily Activities: Has to avoid stairs per ortho  Pain Relieving Factors: chronic pain meds, muscle relaxers, injections from ortho  BMI - recorded: 32.2 Nutritional Status:  BMI > 30  Obese Nutritional Risks: None Diabetes: Yes CBG done?: No Did pt. bring in CBG monitor from home?: Yes Glucose Meter Downloaded?: No  How often do you need to have someone help you when you read instructions, pamphlets, or other written materials from your doctor or pharmacy?: 1 - Never  Interpreter Needed?: No  Information entered by :: LT  Past  Medical History:  Diagnosis Date  . DDD (degenerative disc disease), lumbar   . Depression   . Diabetes mellitus   . Glaucoma   . Hyperlipidemia   . Hypertension   . Seasonal allergies   . Spinal stenosis   . Thyroid disease    History reviewed. No pertinent surgical history. Family History  Problem Relation Age of Onset  . Dementia Mother   . Heart disease Mother   . Stroke Father   . Mental illness Sister   . Mental illness Brother   . Hypertension Son   . Asthma Son   . Heart disease Brother   . Early death Brother   . Diabetes Son 9       Type 1 Diabetes    Social History   Socioeconomic History  . Marital status: Married    Spouse name: Not on file  . Number of children: Not on file  . Years of education: Not on file  . Highest education level: Not on file  Occupational History  . Not on file  Social Needs  . Financial resource strain: Not on file  . Food insecurity:    Worry: Not on file    Inability: Not on file  . Transportation needs:    Medical: Not on file    Non-medical: Not on file  Tobacco Use  . Smoking status: Former Smoker    Types: Cigarettes    Last attempt to quit: 05/17/2018    Years since quitting: 0.0  . Smokeless tobacco: Never Used  Substance and Sexual Activity  . Alcohol use: Yes    Comment: occasional  . Drug use: No  . Sexual activity: Not Currently  Lifestyle  . Physical activity:    Days per week: 3 days    Minutes per session: 30 min  . Stress: Not on file  Relationships  . Social connections:    Talks on phone: Not on file    Gets together: Not on file    Attends religious service: Not on file    Active member of club or organization: Not on file    Attends meetings of clubs or organizations: Not on file    Relationship status: Not on file  Other Topics Concern  . Not on file  Social History Narrative  . Not on file    Outpatient Encounter Medications as of 06/01/2018  Medication Sig  . amLODipine (NORVASC)  10 MG tablet Take 10 mg by mouth daily.  Marland Kitchen aspirin 81 MG chewable tablet Chew 81 mg by mouth daily.  Marland Kitchen atorvastatin (LIPITOR) 80 MG tablet Take 80 mg by mouth daily.  . carvedilol (COREG) 6.25 MG tablet Take 3.125 mg by mouth daily.  . Cholecalciferol (VITAMIN D3) 5000 units CAPS Take by mouth.  . cyclobenzaprine (FLEXERIL) 10 MG tablet Take 10 mg by mouth 3 (three) times daily as needed for muscle spasms.  Marland Kitchen glimepiride (AMARYL) 2 MG tablet Take 1 mg by mouth daily with breakfast.  . HYDROcodone-acetaminophen (NORCO/VICODIN) 5-325 MG tablet Take 1 tablet by mouth every 6 (six) hours as needed for moderate  pain.  . latanoprost (XALATAN) 0.005 % ophthalmic solution Place 1 drop into both eyes at bedtime.  Marland Kitchen levothyroxine (SYNTHROID, LEVOTHROID) 137 MCG tablet Take 137 mcg by mouth daily before breakfast.  . losartan (COZAAR) 100 MG tablet Take 100 mg by mouth daily.  . meloxicam (MOBIC) 7.5 MG tablet Take 7.5 mg by mouth daily.  . metFORMIN (GLUCOPHAGE) 1000 MG tablet Take 1,000 mg by mouth 2 (two) times daily with a meal.  . Omega-3 Fatty Acids (FISH OIL) 1000 MG CAPS Take by mouth.  Marland Kitchen omeprazole (PRILOSEC) 20 MG capsule Take 20 mg by mouth daily.  . Potassium 99 MG TABS Take by mouth.  . venlafaxine (EFFEXOR) 37.5 MG tablet Take 37.5 mg by mouth 2 (two) times daily with a meal.  . zolpidem (AMBIEN) 10 MG tablet Take 5 mg by mouth at bedtime as needed for sleep.   No facility-administered encounter medications on file as of 06/01/2018.     Activities of Daily Living In your present state of health, do you have any difficulty performing the following activities: 06/01/2018  Hearing? N  Vision? N  Difficulty concentrating or making decisions? N  Walking or climbing stairs? Y  Dressing or bathing? N  Doing errands, shopping? N  Preparing Food and eating ? N  Using the Toilet? N  In the past six months, have you accidently leaked urine? N  Do you have problems with loss of bowel control? N   Managing your Medications? N  Managing your Finances? N  Housekeeping or managing your Housekeeping? N  Some recent data might be hidden    Patient Care Team: [place], Modena Nunnery, MD as PCP - General (Family Medicine)     [facility]- Dr. Berenice Primas and Dr. Guy Sandifer in [place], [place] in [place], [place]  Diabetic Foot exam performed today: Diabetic Foot Exam   Title   Diabetic Foot Exam - detailed  Date & Time: 06/01/2018 9:22 AM Diabetic Foot exam was performed with the following findings: Yes  Visual Foot Exam completed.: Yes  Is there a history of foot ulcer?: No  Is there a foot ulcer now?: No  Is there swelling?: No  Is there elevated skin temperature?: No  Is there abnormal foot shape?: No  Is there a claw toe deformity?: No  Are the toenails long?: No  Are the toenails thick?: No  Are the toenails ingrown?: No  Is the skin thin, fragile, shiny and hairless?": No  Normal Range of Motion?: No  Is there foot or ankle muscle weakness?: No  Do you have pain in calf while walking?: No  Are the shoes appropriate in style and fit?: No  Can the patient see the bottom of their feet?: No  Right Posterior Tibialis: Present Left posterior Tibialis: Present  Right Dorsalis Pedis: Present Left Dorsalis Pedis: Present     Semmes-Weinstein Monofilament Test  "+" means "has sensation" and "-" means "no sensation"  R Foot Test Control: Neg L Foot Test Control: Neg  R Site 1-Great Toe: Neg L Site 1-Great Toe: Neg  R Site 4: Neg L Site 4: Neg  R site 5: Neg L Site 5: Neg  R Site 6: Neg L Site 6: Neg     Image components are not supported.  Image components are not supported. Image components are not supported.  [place]  Comments   Depression screen [facility] 2/9 06/01/2018 04/06/2017  Decreased Interest 1 1  Down, Depressed, Hopeless 1 1  PHQ -  2 Score 2 2  Altered sleeping 1 1  Tired, decreased energy 1 2  Change in appetite 3 3  Feeling bad or failure about yourself  1  1  Trouble concentrating 0 1  Moving slowly or fidgety/restless 0 0  Suicidal thoughts 0 0  PHQ-9 Score 8 10  Difficult doing work/chores Somewhat difficult Somewhat difficult   + PHQ 9, pt currently treated and managed at [facility], states that sx vary with chronic pain, no SI, HI, or AVH     Office Visit from 06/01/2018 in [place]  AUDIT-C Score  1     Cognitive Testing   Alert? Yes  Normal Appearance?Yes  Oriented to person? Yes  Place? Yes  Time? Yes  Recall of three objects? Yes  Can perform simple calculations? Yes  Displays appropriate judgment?Yes  Can read the correct time from a watch face?Yes    Fall Risk Fall Risk  06/01/2018 04/06/2017  Falls in the past year? Yes No  Number falls in past yr: 1 -  Injury with Fall? No -  Risk for fall due to : History of fall(s);Impaired mobility -  Follow up Education provided;Falls prevention discussed;Falls evaluation completed -   Is the patient's home free of loose throw rugs in walkways, pet beds, electrical cords, etc?   yes      Grab bars in the bathroom? yes      Handrails on the stairs?   yes      Adequate lighting?   yes     Assessment:   This is a routine wellness examination for [facility].  Exercise Activities and Dietary recommendations Current Exercise Habits: Home exercise routine;Structured exercise class, Type of exercise: stretching;walking, Time (Minutes): 30, Frequency (Times/Week): 3, Weekly Exercise (Minutes/Week): 90, Intensity: Mild, Exercise limited by: orthopedic condition(s)  Goals    . DIET - REDUCE CALORIE INTAKE        Immunization History  Administered Date(s) Administered  . Influenza Split 10/18/2010, 07/18/2012  . MMR 04/15/1997  . Pneumococcal Conjugate-13 09/20/2016  . Pneumococcal Polysaccharide-23 08/25/2012, 08/09/2013  . Td 04/15/1997, 10/18/2002  . Tdap 10/18/2009, 02/22/2012  . Zoster 02/22/2012   Advised to return for Flu vaccine  Qualifies for Shingles  Vaccine?  Done 2013 Screening Tests  Health Maintenance  Topic Date Due  . OPHTHALMOLOGY EXAM  08/27/1961  . COLONOSCOPY  08/27/2001  . INFLUENZA VACCINE  05/18/2018  . PNA vac Low Risk Adult (2 of 2 - PPSV23) 08/09/2018  . HEMOGLOBIN A1C  12/02/2018  . FOOT EXAM  06/02/2019  . MAMMOGRAM  11/02/2019  . TETANUS/TDAP  02/21/2022  . DEXA SCAN  Completed  . Hepatitis C Screening  Completed    Cancer Screenings: Lung: Low Dose CT Chest recommended if Age 78-80 years, 30 pack-year currently smoking OR have quit w/in 15years. Patient does not qualify. Breast:  Up to date on Mammogram? Yes  - done at [facility] - obtaining records Up to date of Bone Density/Dexa? Yes in chart [facility]  Additional Screenings: Hepatitis C Screening:  Ordered today     Plan:     67 y.o. y/o female presents for medicare well visit - all chronic health conditions managed at [place] She is fall risk with recent fall and hx of spinal stenosis/DDD - refer to PT for eval/tx Obesity, DM - referral for nutrition therapy    ICD-10-CM   1. Encounter for Medicare annual wellness exam Z00.00   2. Essential hypertension I10  CBC with Differential/Platelet    COMPLETE METABOLIC PANEL WITH GFR  3. Diabetes mellitus without complication (HCC) Q25.9 Lipid panel    CBC with Differential/Platelet    COMPLETE METABOLIC PANEL WITH GFR    Microalbumin, urine    Hemoglobin A1c    Referral to Nutrition and Diabetes Services  4. Hypothyroidism, unspecified type E03.9 TSH  5. Hyperlipidemia associated with type 2 diabetes mellitus (HCC) E11.69 Lipid panel   E78.5   6. Need for hepatitis C screening test Z11.59 Hepatitis C antibody  7. Class 1 obesity with body mass index (BMI) of 33.0 to 33.9 in adult, unspecified obesity type, unspecified whether serious comorbidity present E66.9    Z68.33   8. DDD (degenerative disc disease), lumbar M51.36 Ambulatory referral to Physical Therapy  9. Spinal stenosis  of lumbar region with neurogenic claudication M48.062 Ambulatory referral to Physical Therapy  10. Risk for falls Z91.81 Ambulatory referral to Physical Therapy     I have personally reviewed and noted the following in the patient's chart:   . Medical and social history . Use of alcohol, tobacco or illicit drugs  . Current medications and supplements . Functional ability and status . Nutritional status . Physical activity . Advanced directives . List of other physicians . Hospitalizations, surgeries, and ER visits in previous 12 months . Vitals . Screenings to include cognitive, depression, and falls . Referrals and appointments  In addition, I have reviewed and discussed with patient certain preventive protocols, quality metrics, and best practice recommendations. A written personalized care plan for preventive services as well as general preventive health recommendations were provided to patient.     Delsa Grana, PA-C  06/06/2018

## 2018-06-02 LAB — CBC WITH DIFFERENTIAL/PLATELET
Basophils Absolute: 29 cells/uL (ref 0–200)
Basophils Relative: 0.5 %
EOS PCT: 2.4 %
Eosinophils Absolute: 139 cells/uL (ref 15–500)
HEMATOCRIT: 43.6 % (ref 35.0–45.0)
HEMOGLOBIN: 14.5 g/dL (ref 11.7–15.5)
Lymphs Abs: 1827 cells/uL (ref 850–3900)
MCH: 30.9 pg (ref 27.0–33.0)
MCHC: 33.3 g/dL (ref 32.0–36.0)
MCV: 92.8 fL (ref 80.0–100.0)
MONOS PCT: 7.3 %
MPV: 10.5 fL (ref 7.5–12.5)
NEUTROS ABS: 3381 {cells}/uL (ref 1500–7800)
Neutrophils Relative %: 58.3 %
Platelets: 235 10*3/uL (ref 140–400)
RBC: 4.7 10*6/uL (ref 3.80–5.10)
RDW: 14 % (ref 11.0–15.0)
Total Lymphocyte: 31.5 %
WBC mixed population: 423 cells/uL (ref 200–950)
WBC: 5.8 10*3/uL (ref 3.8–10.8)

## 2018-06-02 LAB — COMPLETE METABOLIC PANEL WITH GFR
AG Ratio: 1.6 (calc) (ref 1.0–2.5)
ALBUMIN MSPROF: 4.3 g/dL (ref 3.6–5.1)
ALKALINE PHOSPHATASE (APISO): 96 U/L (ref 33–130)
ALT: 12 U/L (ref 6–29)
AST: 11 U/L (ref 10–35)
BUN: 9 mg/dL (ref 7–25)
CO2: 25 mmol/L (ref 20–32)
Calcium: 9.6 mg/dL (ref 8.6–10.4)
Chloride: 102 mmol/L (ref 98–110)
Creat: 0.72 mg/dL (ref 0.50–0.99)
GFR, Est African American: 101 mL/min/{1.73_m2} (ref >=60)
GFR, Est Non African American: 87 mL/min/{1.73_m2} (ref >=60)
GLOBULIN: 2.7 g/dL (ref 1.9–3.7)
Glucose, Bld: 84 mg/dL (ref 65–99)
POTASSIUM: 4.1 mmol/L (ref 3.5–5.3)
SODIUM: 140 mmol/L (ref 135–146)
Total Bilirubin: 0.5 mg/dL (ref 0.2–1.2)
Total Protein: 7 g/dL (ref 6.1–8.1)

## 2018-06-02 LAB — LIPID PANEL
CHOL/HDL RATIO: 3 (calc) (ref <5.0)
CHOLESTEROL: 157 mg/dL (ref <200)
HDL: 52 mg/dL (ref >50)
LDL CHOLESTEROL (CALC): 81 mg/dL
Non-HDL Cholesterol (Calc): 105 mg/dL (calc) (ref <130)
Triglycerides: 137 mg/dL (ref <150)

## 2018-06-02 LAB — HEMOGLOBIN A1C
HEMOGLOBIN A1C: 7.5 %{Hb} — AB (ref <5.7)
Mean Plasma Glucose: 169 (calc)
eAG (mmol/L): 9.3 (calc)

## 2018-06-02 LAB — TSH: TSH: 8.47 m[IU]/L — AB (ref 0.40–4.50)

## 2018-06-02 LAB — HEPATITIS C ANTIBODY
Hepatitis C Ab: NONREACTIVE
SIGNAL TO CUT-OFF: 0.08 (ref <1.00)

## 2018-06-02 LAB — MICROALBUMIN, URINE: MICROALB UR: 0.3 mg/dL

## 2018-06-06 ENCOUNTER — Encounter: Payer: Self-pay | Admitting: Family Medicine

## 2018-06-16 ENCOUNTER — Telehealth: Payer: Self-pay | Admitting: Family Medicine

## 2018-06-16 DIAGNOSIS — E039 Hypothyroidism, unspecified: Secondary | ICD-10-CM

## 2018-06-16 MED ORDER — LEVOTHYROXINE SODIUM 150 MCG PO TABS: 150.0000 ug | ORAL_TABLET | Freq: Every day | ORAL | 0 refills | Status: DC

## 2018-06-16 NOTE — Telephone Encounter (Signed)
Spoke with patient and informed her of medications changes and recommendations as listed below by Danelle BerryLeisa Tapia, PA. Patient verbalized understanding and stated she would like to work on diet and exercise vs add another diabetic medication. She would like to defer on the referral at this time.

## 2018-06-16 NOTE — Telephone Encounter (Signed)
Please call pt back regarding last visit, labs and her [facility] [place] [facility]  Pt TSH high, will need dose increase in daily synthroid, stop taking 137 mcg and start 150 mcg.  Please review with pt how to take, must take on an empty stomach, either first thing in the morning and no eating for at least one hour afterwards, or at night w/o eating 2 hours prior.   She will need to come recheck labs in 6 weeks to recheck her thyroid level, we would not need an OV to recheck this, future TSH ordered, recheck 6 weeks after dose change (around Jul 31, 2018).  Her diabetes is undercontrolled, Hb A1C 7.5 and goal would be 6.5-7.0.   Will need an additional medication to treat, or she can work on diet and exercise and qualifies for DM and MNT which may help improve average sugars.  Please advise if she would like the diabetes/nutritional referral and I will order that, or have her come in for a OV to address DM management with med changes.  Danelle BerryLeisa Layla Gramm, PA-C

## 2018-07-24 ENCOUNTER — Ambulatory Visit (INDEPENDENT_AMBULATORY_CARE_PROVIDER_SITE_OTHER): Payer: PPO | Admitting: Physician Assistant

## 2018-07-24 ENCOUNTER — Encounter: Payer: Self-pay | Admitting: Physician Assistant

## 2018-07-24 VITALS — BP 122/86 | HR 116 | Temp 98.1°F | Resp 16 | Ht 67.0 in | Wt 208.6 lb

## 2018-07-24 DIAGNOSIS — E039 Hypothyroidism, unspecified: Secondary | ICD-10-CM

## 2018-07-24 DIAGNOSIS — F320 Major depressive disorder, single episode, mild: Secondary | ICD-10-CM | POA: Diagnosis not present

## 2018-07-24 DIAGNOSIS — F5104 Psychophysiologic insomnia: Secondary | ICD-10-CM

## 2018-07-24 DIAGNOSIS — E785 Hyperlipidemia, unspecified: Secondary | ICD-10-CM

## 2018-07-24 DIAGNOSIS — E1169 Type 2 diabetes mellitus with other specified complication: Secondary | ICD-10-CM | POA: Diagnosis not present

## 2018-07-24 DIAGNOSIS — M5136 Other intervertebral disc degeneration, lumbar region: Secondary | ICD-10-CM | POA: Diagnosis not present

## 2018-07-24 DIAGNOSIS — I10 Essential (primary) hypertension: Secondary | ICD-10-CM

## 2018-07-24 DIAGNOSIS — E119 Type 2 diabetes mellitus without complications: Secondary | ICD-10-CM | POA: Diagnosis not present

## 2018-07-24 MED ORDER — LEVOTHYROXINE SODIUM 175 MCG PO TABS: 175.0000 ug | ORAL_TABLET | Freq: Every day | ORAL | 1 refills | Status: AC

## 2018-07-24 MED ORDER — CARVEDILOL 6.25 MG PO TABS: 3.1250 mg | ORAL_TABLET | Freq: Every day | ORAL | 1 refills | Status: DC

## 2018-07-24 MED ORDER — LOSARTAN POTASSIUM 100 MG PO TABS: 100.0000 mg | ORAL_TABLET | Freq: Every day | ORAL | 1 refills | Status: AC

## 2018-07-24 NOTE — Progress Notes (Signed)
Patient ID: Carolyn Morse MRN: [id number], DOB: [birth year], 67 y.o. Date of Encounter: _0 @  Chief Complaint:  Chief Complaint  Patient presents with  . Hypertension    HPI: 67 y.o. year old female  presents with above.   I reviewed her Medicare wellness visit with Lattie Haw from 06/01/2018. That note references patient going to the [place] for her chronic medical problems and medications. Therefore today I asked patient her [facility]. She states that now that she has Medicare she wants to start coming here and having [place] manage her medications and her chronic medical problems. She states that she "has had 3 doctors at the [place] and has not even met the fourth doctor yet." Says that she "usually goes online and orders refills for her medicines but her blood pressure medicine was not even on the list so that was part of her reason for needing to come today for this visit at this time.  Notes that she does have amlodipine for 90-day supply.   However says that her losartan will run out in a few days.  She takes that--- a half a tablet once a day.   States that her Coreg--- she takes a half a tablet twice daily.  States that she just took her last half a tablet this morning and is going to be out of that medicine.  Also she states that our office is closer and a better location.   For the [place] clinic she goes to [place] "and if she has to go to the hospital for some reason or for her mammogram" that she has to go to [place].  Therefore is wanting to transition her care to our office.  I reviewed that she had full set of labs at her visit here on 06/01/2018. TSH was high at 8.47. A1c was 7.5. Other labs were good. LDL 81 CBC normal CMET normal   Past Medical History:  Diagnosis Date  . DDD (degenerative disc disease), lumbar   . Depression   . Diabetes mellitus   . Glaucoma   . Hyperlipidemia   . Hypertension   . Seasonal  allergies   . Spinal stenosis   . Thyroid disease      Home Meds: Outpatient Medications Prior to Visit  Medication Sig Dispense Refill  . amLODipine (NORVASC) 10 MG tablet Take 10 mg by mouth daily.    Marland Kitchen aspirin 81 MG chewable tablet Chew 81 mg by mouth daily.    Marland Kitchen atorvastatin (LIPITOR) 80 MG tablet Take 80 mg by mouth daily.    . Cholecalciferol (VITAMIN D3) 5000 units CAPS Take by mouth.    . cyclobenzaprine (FLEXERIL) 10 MG tablet Take 10 mg by mouth 3 (three) times daily as needed for muscle spasms.    Marland Kitchen glimepiride (AMARYL) 2 MG tablet Take 1 mg by mouth daily with breakfast.    . HYDROcodone-acetaminophen (NORCO/VICODIN) 5-325 MG tablet Take 1 tablet by mouth every 6 (six) hours as needed for moderate pain.    Marland Kitchen latanoprost (XALATAN) 0.005 % ophthalmic solution Place 1 drop into both eyes at bedtime.    . meloxicam (MOBIC) 7.5 MG tablet Take 7.5 mg by mouth daily.    . metFORMIN (GLUCOPHAGE) 1000 MG tablet Take 1,000 mg by mouth 2 (two) times daily with a meal.    . omeprazole (PRILOSEC) 20 MG capsule Take 20 mg by mouth daily.    . Potassium 99  MG TABS Take by mouth.    . venlafaxine (EFFEXOR) 37.5 MG tablet Take 37.5 mg by mouth 2 (two) times daily with a meal.    . zolpidem (AMBIEN) 10 MG tablet Take 5 mg by mouth at bedtime as needed for sleep.    . carvedilol (COREG) 6.25 MG tablet Take 3.125 mg by mouth daily.    Marland Kitchen levothyroxine (SYNTHROID, LEVOTHROID) 150 MCG tablet Take 1 tablet (150 mcg total) by mouth daily before breakfast. 90 tablet 0  . losartan (COZAAR) 100 MG tablet Take 100 mg by mouth daily.    . Omega-3 Fatty Acids (FISH OIL) 1000 MG CAPS Take by mouth.     No facility-administered medications prior to visit.     Allergies: No Known Allergies  Social History   Socioeconomic History  . Marital status: Married    Spouse name: Not on file  . Number of children: Not on file  . Years of education: Not on file  . Highest education level: Not on file    Occupational History  . Not on file  Social Needs  . Financial resource strain: Not on file  . Food insecurity:    Worry: Not on file    Inability: Not on file  . Transportation needs:    Medical: Not on file    Non-medical: Not on file  Tobacco Use  . Smoking status: Former Smoker    Types: Cigarettes    Last attempt to quit: 05/17/2018    Years since quitting: 0.1  . Smokeless tobacco: Never Used  Substance and Sexual Activity  . Alcohol use: Yes    Comment: occasional  . Drug use: No  . Sexual activity: Not Currently  Lifestyle  . Physical activity:    Days per week: 3 days    Minutes per session: 30 min  . Stress: Not on file  Relationships  . Social connections:    Talks on phone: Not on file    Gets together: Not on file    Attends religious service: Not on file    Active member of club or organization: Not on file    Attends meetings of clubs or organizations: Not on file    Relationship status: Not on file  . Intimate partner violence:    Fear of current or ex partner: Not on file    Emotionally abused: Not on file    Physically abused: Not on file    Forced sexual activity: Not on file  Other Topics Concern  . Not on file  Social History Narrative  . Not on file    Family History  Problem Relation Age of Onset  . Dementia Mother   . Heart disease Mother   . Stroke Father   . Mental illness Sister   . Mental illness Brother   . Hypertension Son   . Asthma Son   . Heart disease Brother   . Early death Brother   . Diabetes Son 21       Type 1 Diabetes      Review of Systems:  See HPI for pertinent ROS. All other ROS negative.    Physical Exam: Blood pressure 122/86, pulse (!) 116, temperature 98.1 F (36.7 C), temperature source Oral, resp. rate 16, height _0  (1.702 m), weight 94.6 kg, SpO2 98 %., Body mass index is 32.67 kg/m. General: AAF. Appears in no acute distress. Neck: Supple. No thyromegaly. No lymphadenopathy. No carotid bruits.   Lungs: Clear bilaterally to auscultation  without wheezes, rales, or rhonchi. Breathing is unlabored. Heart: RRR with S1 S2. No murmurs, rubs, or gallops. Abdomen: Soft, non-tender, non-distended with normoactive bowel sounds. No hepatomegaly. No rebound/guarding. No obvious abdominal masses. Musculoskeletal:  Strength and tone normal for age. Extremities/Skin: Warm and dry.  No LE edema.  Neuro: Alert and oriented X 3. Moves all extremities spontaneously. Gait is normal. CNII-XII grossly in tact. Psych:  Responds to questions appropriately with a normal affect.     ASSESSMENT AND PLAN:  67 y.o. year old female with   1. Essential hypertension CMET was normal 06/01/2018.  Continue current BP meds the same. Noted that blood pressure is slightly elevated today.  Will recheck at her next visit and if elevated again then will adjust meds. - losartan (COZAAR) 100 MG tablet; Take 1 tablet (100 mg total) by mouth daily.  Dispense: 90 tablet; Refill: 1 - carvedilol (COREG) 6.25 MG tablet; Take 0.5 tablets (3.125 mg total) by mouth daily.  Dispense: 90 tablet; Refill: 1  2. Diabetes mellitus without complication (HCC) N3Z was 7.5 at lab 06/01/2018.  Will recheck A1c at her next visit in 6 weeks.  That will be 3 months after her prior A1c.  If this next A1c is elevated again then will add medications.  Currently on metformin.  So at her next visit we will further discuss low carbohydrate diet and exercise and find out where she is with compliance with this part.  3. Hypothyroidism, unspecified type Lab 06/01/2018 TSH was 8.47.  Currently on levothyroxine 150 mcg daily.  Increase levothyroxine to 175 mcg.  She will return for office visit and lab in 6 weeks.  That time we will recheck TSH and A1c. - levothyroxine (SYNTHROID) 175 MCG tablet; Take 1 tablet (175 mcg total) by mouth daily before breakfast.  Dispense: 30 tablet; Refill: 1  4. Hyperlipidemia associated with type 2 diabetes mellitus ([place]) Lab  06/01/2018 showed LDL 81.  This is good.  Continue current treatment.  5. Chronic insomnia She is on Ambien.  Insomnia controlled with Ambien.  6. Current mild episode of major depressive disorder, unspecified whether recurrent ([place]) This is stable, controlled.  On venlafaxine.  7. DDD (degenerative disc disease), lumbar This is been managed by [facility] orthopedic.  At the end of visit 1 I am discussing scheduling follow-up here and fact that she wants to transition care here rather than returning to [place] she then asked about refill on hydrocodone.  I replied that our office does not prescribe pain pills like hydrocodone on a long-term basis.  Asked what she is using this for and how frequently she needs this.  Asked if her orthopedic was prescribing this.  She states that no orthopedic was not prescribing this.  States that it is been prescribed by the [place].  States that it is been prescribed for some achiness and she points to kind of her shoulder region and neck region bilaterally.  States that she has fibromyalgia and that is what she is on the venlafaxine and the hydrocodone for.  I reviewed with her that if this is just some mild achiness to use her Mobic and some over-the-counter Tylenol and that we do not prescribe long-term opioids.  She voices understanding and agrees.  She is scheduling a follow-up office visit in 6 weeks.  At that visit we will recheck TSH and A1c.  Also if blood pressure is elevated will adjust BP meds at that visit as well.   Signed, Karis Juba,  PA, BSFM 07/24/2018 12:50 PM

## 2018-09-05 ENCOUNTER — Other Ambulatory Visit: Payer: Self-pay

## 2018-09-05 ENCOUNTER — Ambulatory Visit (INDEPENDENT_AMBULATORY_CARE_PROVIDER_SITE_OTHER): Payer: PPO | Admitting: Family Medicine

## 2018-09-05 ENCOUNTER — Encounter: Payer: Self-pay | Admitting: Family Medicine

## 2018-09-05 VITALS — BP 128/82 | HR 100 | Temp 98.3°F | Resp 14 | Ht 67.0 in | Wt 205.0 lb

## 2018-09-05 DIAGNOSIS — I10 Essential (primary) hypertension: Secondary | ICD-10-CM | POA: Diagnosis not present

## 2018-09-05 DIAGNOSIS — E785 Hyperlipidemia, unspecified: Secondary | ICD-10-CM | POA: Diagnosis not present

## 2018-09-05 DIAGNOSIS — E1169 Type 2 diabetes mellitus with other specified complication: Secondary | ICD-10-CM | POA: Diagnosis not present

## 2018-09-05 DIAGNOSIS — E119 Type 2 diabetes mellitus without complications: Secondary | ICD-10-CM | POA: Diagnosis not present

## 2018-09-05 DIAGNOSIS — H0260 Xanthelasma of unspecified eye, unspecified eyelid: Secondary | ICD-10-CM | POA: Diagnosis not present

## 2018-09-05 DIAGNOSIS — E039 Hypothyroidism, unspecified: Secondary | ICD-10-CM

## 2018-09-05 MED ORDER — BLOOD GLUCOSE TEST VI STRP: ORAL_STRIP | 1 refills | Status: DC

## 2018-09-05 MED ORDER — LANCETS MISC: 1 refills | Status: DC

## 2018-09-05 MED ORDER — BLOOD GLUCOSE SYSTEM PAK KIT: PACK | 1 refills | Status: DC

## 2018-09-05 NOTE — Patient Instructions (Addendum)
We will call with lab results  Referral to dermatology Glucometer sent in  F/U 4 months

## 2018-09-05 NOTE — Assessment & Plan Note (Signed)
As her medication as far as her thyroid.  Unfortunately she has been taking a very high dose once she ran out of her other prescription.  Up to check her levels and see what we need to adjust.

## 2018-09-05 NOTE — Assessment & Plan Note (Addendum)
Blood pressure is controlled.  I am not sure why she has been taking a half of every tablet instead of the full doses.  Not sure she was having difficulty with finances and they were making the medications last longer.  Pressure any rate is controlled informed not to change to losartan from 50 mg she will continue the carvedilol at 3.125 mg twice a day Continue Norvasc 10 mg daily

## 2018-09-05 NOTE — Progress Notes (Signed)
Subjective:    Patient ID: Carolyn Morse, female    DOB: January 10, 1951, 67 y.o.   MRN: [id number]  Patient presents for Follow-up (is not fasting)  Flu shot UTD  DM- takes metformin regulary, does not take the amaryl every day.    Lipitor has not been taking regulary, also takes 40mg  once a day  Needs a new meter   Depression/anxiety- sepearted from her husband recently, she feels much better, she is not stress eating   Chronic pain- on effexor, mobic, off the  norco ( prevoioulsy from the [place]) and flexeril as needed   GERD- has not needed prilosec regulary   Occ takes potassium OTC for leg cramps   Hypothyroidism- took for 1 month, then started taking 1.5 tablets  of the  ( 225mg )   [facility] but has history of colon polyps Requests a referral to dermatology for the cholesterol deposits of her face.  Review Of Systems:  GEN- denies fatigue, fever, weight loss,weakness, recent illness HEENT- denies eye drainage, change in vision, nasal discharge, CVS- denies chest pain, palpitations RESP- denies SOB, cough, wheeze ABD- denies N/V, change in stools, abd pain GU- denies dysuria, hematuria, dribbling, incontinence MSK- + joint pain, muscle aches, injury Neuro- denies headache, dizziness, syncope, seizure activity       Objective:    BP 128/82   Pulse 100   Temp 98.3 F (36.8 C) (Oral)   Resp 14   Ht 5\' 7"  (1.702 m)   Wt 205 lb (93 kg)   SpO2 98%   BMI 32.11 kg/m  GEN- NAD, alert and oriented x3 HEENT- PERRL, EOMI, non injected sclera, pink conjunctiva, MMM, oropharynx clear Neck- Supple, no thyromegaly CVS- RRR, no murmur RESP-CTAB ABD-NABS,soft,NT,ND  Psych- normal affect and mood  EXT- No edema Skin-yellow cholesterol deposits around both eyes and on cheeks. Pulses- Radial, DP- 2+        Assessment & Plan:    [facility]  [facility] Problem List Items Addressed This Visit      Unprioritized   Diabetes mellitus without complication (HCC)    Her medications are very confusing on what she is taking.  She also [facility] the [facility].  I recheck her labs today.  She is to take the metformin twice a day.  Also discussed the importance of the statin drug with all of her risk factors she agrees to take this.  She is also on 50 mg of losartan.  Check CBG 1-2 times a day, at least fasting daily      Relevant Orders   Hemoglobin A1c   Hyperlipidemia associated with type 2 diabetes mellitus (HCC) - Primary   Hypertension    Blood pressure is controlled.  I am not sure why she has been taking a half of every tablet instead of the full doses.  Not sure she was having difficulty with finances and they were making the medications last longer.  Pressure any rate is controlled informed not to change to losartan from 50 mg she will continue the carvedilol at 3.125 mg twice a day Continue Norvasc 10 mg daily      Relevant Orders   CBC with Differential/Platelet   Comprehensive metabolic panel   Hypothyroidism    As her medication as far as her thyroid.  Unfortunately she has been taking a very high dose once  she ran out of her other prescription.  Up to check her levels and see what we need to adjust.      Relevant Orders   TSH   T3, free   T4, free    Other Visit Diagnoses    Xanthelasma       Relevant Orders   Ambulatory referral to Dermatology      Note: This dictation was prepared with Dragon dictation along with smaller phrase technology. Any transcriptional errors that result from this process are unintentional.

## 2018-09-05 NOTE — Assessment & Plan Note (Addendum)
Her medications are very confusing on what she is taking.  She also [facility].  I recheck her labs today.  She is to take the metformin twice a day.  Also discussed the importance of the statin drug with all of her risk factors she agrees to take this.  She is also on 50 mg of losartan.  Check CBG 1-2 times a day, at least fasting daily

## 2018-09-06 LAB — CBC WITH DIFFERENTIAL/PLATELET
BASOS ABS: 30 {cells}/uL (ref 0–200)
BASOS PCT: 0.6 %
EOS PCT: 2.4 %
Eosinophils Absolute: 120 cells/uL (ref 15–500)
HEMATOCRIT: 42.1 % (ref 35.0–45.0)
HEMOGLOBIN: 14.5 g/dL (ref 11.7–15.5)
LYMPHS ABS: 2000 {cells}/uL (ref 850–3900)
MCH: 30.5 pg (ref 27.0–33.0)
MCHC: 34.4 g/dL (ref 32.0–36.0)
MCV: 88.4 fL (ref 80.0–100.0)
MPV: 10.6 fL (ref 7.5–12.5)
Monocytes Relative: 8.2 %
NEUTROS ABS: 2440 {cells}/uL (ref 1500–7800)
Neutrophils Relative %: 48.8 %
Platelets: 275 10*3/uL (ref 140–400)
RBC: 4.76 10*6/uL (ref 3.80–5.10)
RDW: 12.9 % (ref 11.0–15.0)
Total Lymphocyte: 40 %
WBC mixed population: 410 cells/uL (ref 200–950)
WBC: 5 10*3/uL (ref 3.8–10.8)

## 2018-09-06 LAB — COMPREHENSIVE METABOLIC PANEL
AG RATIO: 1.7 (calc) (ref 1.0–2.5)
ALT: 14 U/L (ref 6–29)
AST: 15 U/L (ref 10–35)
Albumin: 4.4 g/dL (ref 3.6–5.1)
Alkaline phosphatase (APISO): 107 U/L (ref 33–130)
BUN: 7 mg/dL (ref 7–25)
CO2: 27 mmol/L (ref 20–32)
Calcium: 9.8 mg/dL (ref 8.6–10.4)
Chloride: 104 mmol/L (ref 98–110)
Creat: 0.74 mg/dL (ref 0.50–0.99)
Globulin: 2.6 g/dL (calc) (ref 1.9–3.7)
Glucose, Bld: 141 mg/dL — ABNORMAL HIGH (ref 65–99)
POTASSIUM: 3.8 mmol/L (ref 3.5–5.3)
SODIUM: 140 mmol/L (ref 135–146)
Total Bilirubin: 0.5 mg/dL (ref 0.2–1.2)
Total Protein: 7 g/dL (ref 6.1–8.1)

## 2018-09-06 LAB — T4, FREE: FREE T4: 2 ng/dL — AB (ref 0.8–1.8)

## 2018-09-06 LAB — HEMOGLOBIN A1C
EAG (MMOL/L): 8.9 (calc)
Hgb A1c MFr Bld: 7.2 % of total Hgb — ABNORMAL HIGH (ref <5.7)
Mean Plasma Glucose: 160 (calc)

## 2018-09-06 LAB — T3, FREE: T3 FREE: 3.9 pg/mL (ref 2.3–4.2)

## 2018-09-06 LAB — TSH: TSH: 0.04 mIU/L — ABNORMAL LOW (ref 0.40–4.50)

## 2018-09-08 ENCOUNTER — Other Ambulatory Visit: Payer: Self-pay | Admitting: *Deleted

## 2018-09-08 DIAGNOSIS — I10 Essential (primary) hypertension: Secondary | ICD-10-CM

## 2018-09-08 MED ORDER — CARVEDILOL 6.25 MG PO TABS: 3.1250 mg | ORAL_TABLET | Freq: Every day | ORAL | 1 refills | Status: DC

## 2018-09-11 ENCOUNTER — Other Ambulatory Visit: Payer: Self-pay | Admitting: *Deleted

## 2018-09-11 DIAGNOSIS — I10 Essential (primary) hypertension: Secondary | ICD-10-CM

## 2018-09-11 MED ORDER — CARVEDILOL 6.25 MG PO TABS: 3.1250 mg | ORAL_TABLET | Freq: Every day | ORAL | 1 refills | Status: AC

## 2018-09-11 MED ORDER — LANCETS MISC: 1 refills | Status: DC

## 2018-09-11 MED ORDER — BLOOD GLUCOSE TEST VI STRP: ORAL_STRIP | 1 refills | Status: DC

## 2018-09-11 MED ORDER — BLOOD GLUCOSE SYSTEM PAK KIT: PACK | 1 refills | Status: AC

## 2018-09-19 ENCOUNTER — Other Ambulatory Visit: Payer: Self-pay | Admitting: *Deleted

## 2018-09-19 MED ORDER — LANCETS MISC: 1 refills | Status: AC

## 2018-09-21 ENCOUNTER — Encounter: Payer: Self-pay | Admitting: *Deleted

## 2018-09-22 ENCOUNTER — Encounter: Payer: Self-pay | Admitting: *Deleted

## 2018-10-16 DIAGNOSIS — E782 Mixed hyperlipidemia: Secondary | ICD-10-CM | POA: Diagnosis not present

## 2019-01-05 ENCOUNTER — Ambulatory Visit: Payer: PPO | Admitting: Family Medicine

## 2019-03-12 ENCOUNTER — Other Ambulatory Visit: Payer: Self-pay | Admitting: Family Medicine
